# Patient Record
Sex: Female | Born: 2005 | Race: Black or African American | Hispanic: No | Marital: Single | Smoking: Never smoker
Health system: Southern US, Community
[De-identification: ages and names within clinical notes are randomized; demographics above are authoritative.]

## PROBLEM LIST (undated history)

## (undated) HISTORY — PX: TONSILLECTOMY: SUR1361

---
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
## 2023-09-30 ENCOUNTER — Emergency Department (HOSPITAL_COMMUNITY)
Admission: EM | Admit: 2023-09-30 | Discharge: 2023-09-30 | Discharge status: Against Medical Advice | Payer: Medicaid Other | Source: Home / Self Care
